# Patient Record
Sex: Female | Born: 1935 | Race: White | Hispanic: No | Smoking: Never smoker
Health system: Southern US, Community
[De-identification: ages and names within clinical notes are randomized; demographics above are authoritative.]

## PROBLEM LIST (undated history)

## (undated) DIAGNOSIS — F0390 Unspecified dementia without behavioral disturbance: Secondary | ICD-10-CM

## (undated) DIAGNOSIS — I10 Essential (primary) hypertension: Secondary | ICD-10-CM

---

## 2001-09-14 ENCOUNTER — Encounter: Admission: RE | Admit: 2001-09-14 | Discharge: 2001-09-14 | Payer: Self-pay | Admitting: Family Medicine

## 2001-09-14 ENCOUNTER — Encounter: Payer: Self-pay | Admitting: Family Medicine

## 2008-05-29 ENCOUNTER — Ambulatory Visit: Payer: Self-pay | Admitting: Cardiovascular Disease

## 2008-05-30 ENCOUNTER — Inpatient Hospital Stay (HOSPITAL_COMMUNITY): Admission: EM | Admit: 2008-05-30 | Discharge: 2008-06-03 | Payer: Self-pay | Admitting: Emergency Medicine

## 2008-05-31 ENCOUNTER — Encounter (INDEPENDENT_AMBULATORY_CARE_PROVIDER_SITE_OTHER): Payer: Self-pay | Admitting: Internal Medicine

## 2009-07-04 ENCOUNTER — Emergency Department (HOSPITAL_COMMUNITY): Admission: EM | Admit: 2009-07-04 | Discharge: 2009-07-04 | Payer: Self-pay | Admitting: Emergency Medicine

## 2010-07-02 LAB — URINALYSIS, ROUTINE W REFLEX MICROSCOPIC
Bilirubin Urine: NEGATIVE
Glucose, UA: NEGATIVE mg/dL
Specific Gravity, Urine: 1.014 (ref 1.005–1.030)
Urobilinogen, UA: 0.2 mg/dL (ref 0.0–1.0)

## 2010-07-02 LAB — COMPREHENSIVE METABOLIC PANEL
ALT: 14 U/L (ref 0–35)
AST: 20 U/L (ref 0–37)
Alkaline Phosphatase: 58 U/L (ref 39–117)
CO2: 26 mEq/L (ref 19–32)
Calcium: 8.4 mg/dL (ref 8.4–10.5)
Chloride: 106 mEq/L (ref 96–112)
GFR calc Af Amer: >60 mL/min (ref >60)
GFR calc non Af Amer: >60 mL/min (ref >60)
Potassium: 3.4 mEq/L — ABNORMAL LOW (ref 3.5–5.1)
Sodium: 137 mEq/L (ref 135–145)

## 2010-07-02 LAB — BLOOD GAS, ARTERIAL
Bicarbonate: 23.8 mEq/L (ref 20.0–24.0)
Drawn by: 309681
O2 Saturation: 98.6 %
O2 Saturation: 99 %
Patient temperature: 98.6
Patient temperature: 98.6
TCO2: 21.1 mmol/L (ref 0–100)
pH, Arterial: 7.507 — ABNORMAL HIGH (ref 7.350–7.400)
pH, Arterial: 7.6 — ABNORMAL HIGH (ref 7.350–7.400)

## 2010-07-02 LAB — CBC
HCT: 31.6 % — ABNORMAL LOW (ref 36.0–46.0)
HCT: 31.8 % — ABNORMAL LOW (ref 36.0–46.0)
HCT: 36.8 % (ref 36.0–46.0)
Hemoglobin: 10.6 g/dL — ABNORMAL LOW (ref 12.0–15.0)
Hemoglobin: 10.9 g/dL — ABNORMAL LOW (ref 12.0–15.0)
Hemoglobin: 11.2 g/dL — ABNORMAL LOW (ref 12.0–15.0)
MCHC: 33.5 g/dL (ref 30.0–36.0)
MCHC: 33.6 g/dL (ref 30.0–36.0)
MCV: 88.1 fL (ref 78.0–100.0)
MCV: 88.2 fL (ref 78.0–100.0)
Platelets: 285 10*3/uL (ref 150–400)
Platelets: 319 10*3/uL (ref 150–400)
RBC: 3.72 MIL/uL — ABNORMAL LOW (ref 3.87–5.11)
RDW: 13 % (ref 11.5–15.5)
RDW: 13.8 % (ref 11.5–15.5)
RDW: 13.9 % (ref 11.5–15.5)
WBC: 5.3 10*3/uL (ref 4.0–10.5)
WBC: 7.2 10*3/uL (ref 4.0–10.5)

## 2010-07-02 LAB — DIFFERENTIAL
Basophils Relative: 0 % (ref 0–1)
Basophils Relative: 1 % (ref 0–1)
Eosinophils Absolute: 0.1 10*3/uL (ref 0.0–0.7)
Eosinophils Absolute: 0.1 10*3/uL (ref 0.0–0.7)
Eosinophils Relative: 1 % (ref 0–5)
Eosinophils Relative: 2 % (ref 0–5)
Lymphs Abs: 1 10*3/uL (ref 0.7–4.0)
Lymphs Abs: 1.1 10*3/uL (ref 0.7–4.0)
Neutrophils Relative %: 79 % — ABNORMAL HIGH (ref 43–77)

## 2010-07-02 LAB — LIPID PANEL
LDL Cholesterol: 65 mg/dL (ref 0–99)
Total CHOL/HDL Ratio: 3 RATIO
Triglycerides: 51 mg/dL (ref <150)
VLDL: 10 mg/dL (ref 0–40)

## 2010-07-02 LAB — AMMONIA: Ammonia: 15 umol/L (ref 11–35)

## 2010-07-02 LAB — POCT I-STAT, CHEM 8
Creatinine, Ser: 0.9 mg/dL (ref 0.4–1.2)
HCT: 38 % (ref 36.0–46.0)
Hemoglobin: 12.9 g/dL (ref 12.0–15.0)
Potassium: 3.9 mEq/L (ref 3.5–5.1)
Sodium: 135 mEq/L (ref 135–145)
TCO2: 28 mmol/L (ref 0–100)

## 2010-07-02 LAB — URINE MICROSCOPIC-ADD ON

## 2010-07-02 LAB — BASIC METABOLIC PANEL
CO2: 25 mEq/L (ref 19–32)
Chloride: 108 mEq/L (ref 96–112)
GFR calc Af Amer: >60 mL/min (ref >60)
Sodium: 140 mEq/L (ref 135–145)

## 2010-07-02 LAB — HEMOGLOBIN A1C: Hgb A1c MFr Bld: 5.9 % (ref 4.6–6.1)

## 2010-07-02 LAB — CARDIAC PANEL(CRET KIN+CKTOT+MB+TROPI)
CK, MB: 3.8 ng/mL (ref 0.3–4.0)
CK, MB: 4.6 ng/mL — ABNORMAL HIGH (ref 0.3–4.0)
Relative Index: 3 — ABNORMAL HIGH (ref 0.0–2.5)
Total CK: 152 U/L (ref 7–177)

## 2010-07-02 LAB — TROPONIN I
Troponin I: 0.19 ng/mL — ABNORMAL HIGH (ref 0.00–0.06)
Troponin I: 0.3 ng/mL — ABNORMAL HIGH (ref 0.00–0.06)

## 2010-07-02 LAB — URINE CULTURE: Colony Count: NO GROWTH

## 2010-07-02 LAB — CK TOTAL AND CKMB (NOT AT ARMC)
CK, MB: 3 ng/mL (ref 0.3–4.0)
Relative Index: 2.4 (ref 0.0–2.5)

## 2010-07-10 ENCOUNTER — Other Ambulatory Visit (HOSPITAL_COMMUNITY): Payer: Self-pay | Admitting: Physical Medicine and Rehabilitation

## 2010-07-10 DIAGNOSIS — Z780 Asymptomatic menopausal state: Secondary | ICD-10-CM

## 2010-07-10 DIAGNOSIS — T07XXXA Unspecified multiple injuries, initial encounter: Secondary | ICD-10-CM

## 2010-07-13 ENCOUNTER — Ambulatory Visit (HOSPITAL_COMMUNITY)
Admission: RE | Admit: 2010-07-13 | Discharge: 2010-07-13 | Disposition: A | Discharge status: Home / Self Care | Payer: Medicare Other | Source: Ambulatory Visit | Attending: Physical Medicine and Rehabilitation | Admitting: Physical Medicine and Rehabilitation

## 2010-07-13 DIAGNOSIS — Z13820 Encounter for screening for osteoporosis: Secondary | ICD-10-CM | POA: Insufficient documentation

## 2010-07-13 DIAGNOSIS — T07XXXA Unspecified multiple injuries, initial encounter: Secondary | ICD-10-CM

## 2010-07-13 DIAGNOSIS — Z8781 Personal history of (healed) traumatic fracture: Secondary | ICD-10-CM | POA: Insufficient documentation

## 2010-07-13 DIAGNOSIS — Z780 Asymptomatic menopausal state: Secondary | ICD-10-CM | POA: Insufficient documentation

## 2010-08-04 NOTE — H&P (Signed)
NAMENYSSA, SAYEGH NO.:  [id number]   MEDICAL RECORD NO.:  [id number]          PATIENT TYPE:  EMS   LOCATION:  ED                           FACILITY:  [facility]   PHYSICIAN:  Oswald Hillock, MD        DATE OF BIRTH:  [birth year]   DATE OF ADMISSION:  05/29/2008  DATE OF DISCHARGE:                              HISTORY & PHYSICAL   CHIEF COMPLAINT:  Altered mental status.   HISTORY OF PRESENT ILLNESS:  The patient is an 77 year old Caucasian  female who presents to the emergency room after she was reported by  family crawling on the floor in a defensive posture.  Apparently, she  has been having upper respiratory symptoms for the last few days  including a productive cough, however, no fever, rigors, chills,  abdominal pain, nausea, vomiting, diarrhea dizziness, loss of  consciousness or any focal weakness of any part of the body reported.  The patient was not on any medications and has been living with her  daughter off late.  Daughter states that she does have some memory loss  but no significant problems as a result of that.  The patient is very  active and independent in all her ADLs as per the daughter who  accompanies her.   PAST MEDICAL HISTORY:  None.   PAST SURGICAL HISTORY:  D&C and lumpectomy.   SOCIAL HISTORY:  No history of alcohol, tobacco or drug use.  The  patient lives with family.   REVIEW OF SYSTEMS:  An extensive systems was done and all systems are  negative except for the positives mentioned in the history of present  illness.  The patient denies any chest pain, shortness of breath at any  point in time.   PHYSICAL EXAMINATION:  VITALS:  On admission pulse 100, blood pressure  178/81, respiratory rate of 18 with, oxygen saturations of 96% on room  air.  Temperature was initially 99.3.  A recheck was 100.4.  GENERAL:  Elderly Caucasian female in no acute distress, alert, oriented  to person, place and time but does have some deficits with  both recent  as well as remote memory.  HEENT: No scleral icterus.  No pallor.  Ears negative.  No significant  oropharyngeal lesions.  NECK:  Supple.  No lymphadenopathy.  No JVD.  CHEST:  Breath sounds heard bilaterally.  Bilateral rhonchi.  CVS:  S1, S2 plus, regular tachycardia.  No gallop or rub.  ABDOMEN:  Abdomen is soft, nontender.  Bowel sounds present.  EXTREMITIES:  No cyanosis, clubbing or edema.  NECK:  Cranial nerves II to XII are grossly intact.  Motor examination:  Power grade 5/5 all over.  Reflexes are present.  Plantars are  downgoing.  No cerebellar signs.  Gait was not checked.   LABORATORY DATA:  CBC:  WBC count 10.4, hemoglobin 12.3, hematocrit  36.8, platelet count of 319.  Urinalysis showed 0-2 WBCs, 11-20 RBCs,  negative leukocytes.  Her sodium was 135, potassium 3.9, chloride 99,  CO2 was 28, glucose was 103, BUN 13, creatinine 0.9, CK was 109, MB 2.8,  index of 2.4, troponin was 0.19.  EKG showed normal sinus rhythm, normal  PR interval, normal QRS duration, T inversions in the lead II.  No  previous EKGs available for comparison.  Chest x-ray showed chronic  obstructive pulmonary disease/chronic bronchitis without convincing  evidence of acute superimposed process, right greater than left  bilateral pleural parenchymal opacity may be related to scarring and  probable epicardial fat prominence at the right cardiophrenic angle. CT  head:  No acute intracranial abnormality.   IMPRESSION AND PLAN:  This is a case of an 77 year old Caucasian female  with no significant medical problems who presents with altered mental  status.  1. Altered mental status.  Etiology is unclear.  However, it is clear      from the history that the patient has some baseline memory loss.      No focal abnormalities on examination and a CT scan of the chest is      negative.  No metabolic abnormalities or significant infectious      process to account for alteration in her mental  status.  Will      monitor her overnight and order an MRI of the brain in the morning      if she does not show any signs of improvement.  2. Chronic obstructive pulmonary disease exacerbation.  Based on      elevated white count and fever we will treat her as COPD      exacerbation.  Start her on Avelox 4 mg IV.  Get blood cultures x2      and use Xopenex and Atrovent nebs q. 4 and q. 12 h. p.r.n. Will get      a baseline ABG and follow up with the results.  3. Abnormal troponin.  The patient's initial troponin was elevated      0.19.  EKG shows nonspecific changes.  Will admit her to telemetry,      recycle enzymes and cardiology will was consulted by the emergency      room physician.  The likelihood of her elevated troponin being      related to bronchitis is high though.  4. DVT/GI prophylaxis.  Pepcid and subcu heparin.      Oswald Hillock, MD  Electronically Signed     BA/MEDQ  D:  05/30/2008  T:  05/30/2008  Job:  [id number]

## 2010-08-04 NOTE — Discharge Summary (Signed)
[name], PATNODE NO.:  [id number]   MEDICAL RECORD NO.:  [id number]          PATIENT TYPE:  INP   LOCATION:  1419                         FACILITY:  [facility]   PHYSICIAN:  Lonia Blood, M.D.DATE OF BIRTH:  1919/02/11   DATE OF ADMISSION:  05/29/2008  DATE OF DISCHARGE:  06/03/2008                               DISCHARGE SUMMARY   PRIMARY CARE PHYSICIAN:  Gloriajean Dell. Andrey Campanile, M.D.   DISCHARGE DIAGNOSES:  1. Trilobar community-acquired pneumonia.  2. Acute delirium with moderate baseline dementia.      a.     Metabolic workup unrevealing.      b.     Greatly improved with treatment of acute illness.      c.     Likely component of sundowning involved.      d.     CT scan of the head revealing mild generalized volume loss       for age but no acute deficits.  3. Mild aortic stenosis via echocardiogram, asymptomatic.  4. Normocytic anemia, likely related to acute disease.  Outpatient      followup recommended.  5. Elevated troponin, likely secondary to severe pulmonary disease.      No cardiac symptoms.   DISCHARGE MEDICATIONS:  Avelox 400 mg p.o. daily x5 days, then stop.    Patient is advised to keep her scheduled follow-up appointment with Dr.  Benedetto Goad on June 05, 2008.  At that time, the patient should simply  be assessed to assure that she has continued to recover from her  pneumonia.   PROCEDURES:  1. Transthoracic echocardiogram on May 31, 2008.  Overall LV      systolic function normal.  Ejection fraction 60%.  Very mild aortic      valve stenosis appreciable.  2. CT scan of the head on May 29, 2008.  No acute intracranial      abnormality.  Mild generalized volume loss for age.  3. CT scan of the chest on May 30, 2008.  Dense consolidation,      medial segment of the right middle lobe.  Right hilar and      subcarinal lymphadenopathy, which appears to be reactive.  Sequelae      of pulmonary granulomatous disease in the right lung  with biapical      scarring.  Chronic nonunited lower sternum fracture.   HOSPITAL COURSE:  Ms. Jessica Hodge is a very pleasant 77 year old  female who resides in a private home locally with care from her family.  She presented to the hospital on the date of her admission with  complaints of significant altered mental status.  The family stated at  her baseline the patient did display some early signs of dementia but  otherwise was interactive.  She had become much more lethargic and  severely confused.  Initial evaluation suggested the patient was likely  suffering from a pneumonia.  A CT scan was carried out for further  evaluation and did in fact confirm a right middle lobe pneumonia.  Subsequent chest x-ray suggested actual trilobar pneumonia, including  right middle,  right lower, and left lower lobe pneumonia.  The patient  was treated empirically with IV antibiotics, and symptomatic management  was accomplished with nebulizers.  Patient tolerated this quite well and  made a significant rally.  From a pulmonary standpoint, her lungs are  now clear to auscultation.  Her O2 sats are normal on room air.  She is  displaying no clinical evidence of ongoing infection.  She is tolerating  a titration from IV antibiotics to p.o. antibiotics without significant  difficulty.   During the patient's hospital stay, she was appreciated to have a  significant systolic murmur.  Echocardiogram was accomplished.  This  revealed a preserved LV systolic function with a preserved EF.  Of note  was a mild aortic stenosis which was likely the etiology of the  patient's murmur.  This is now clinically well compensated.   As noted above, the patient's reported significant confusion at home at  baseline.  This had worsened severely at the time of her acute  hospitalization.  It was felt that she likely suffers with a baseline  vascular dementia, and it was worsened with acute illness and then  further  compounded by sundowning during her hospital stay.  Full  metabolic workup was accomplished, including normal folic acid, normal  B12, normal TSH, normal ammonia, and a normal pCO2.   With treatment of her acute issues, the patient returned to what her  family states is her baseline mental status.      Lonia Blood, M.D.  Electronically Signed     JTM/MEDQ  D:  06/03/2008  T:  06/03/2008  Job:  [id number]   cc:   Gloriajean Dell. Andrey Campanile, M.D.  Fax: [phone]

## 2010-09-30 ENCOUNTER — Emergency Department (HOSPITAL_COMMUNITY)
Admission: EM | Admit: 2010-09-30 | Discharge: 2010-09-30 | Disposition: A | Discharge status: Home / Self Care | Payer: Medicare Other | Attending: Emergency Medicine | Admitting: Emergency Medicine

## 2010-09-30 ENCOUNTER — Emergency Department (HOSPITAL_COMMUNITY): Payer: Medicare Other

## 2010-09-30 DIAGNOSIS — Y92009 Unspecified place in unspecified non-institutional (private) residence as the place of occurrence of the external cause: Secondary | ICD-10-CM | POA: Insufficient documentation

## 2010-09-30 DIAGNOSIS — S0100XA Unspecified open wound of scalp, initial encounter: Secondary | ICD-10-CM | POA: Insufficient documentation

## 2010-09-30 DIAGNOSIS — F0280 Dementia in other diseases classified elsewhere without behavioral disturbance: Secondary | ICD-10-CM | POA: Insufficient documentation

## 2010-09-30 DIAGNOSIS — Z79899 Other long term (current) drug therapy: Secondary | ICD-10-CM | POA: Insufficient documentation

## 2010-09-30 DIAGNOSIS — S0990XA Unspecified injury of head, initial encounter: Secondary | ICD-10-CM | POA: Insufficient documentation

## 2010-09-30 DIAGNOSIS — Y998 Other external cause status: Secondary | ICD-10-CM | POA: Insufficient documentation

## 2010-09-30 DIAGNOSIS — W06XXXA Fall from bed, initial encounter: Secondary | ICD-10-CM | POA: Insufficient documentation

## 2010-09-30 DIAGNOSIS — I10 Essential (primary) hypertension: Secondary | ICD-10-CM | POA: Insufficient documentation

## 2015-05-22 ENCOUNTER — Emergency Department (HOSPITAL_COMMUNITY)
Admission: EM | Admit: 2015-05-22 | Discharge: 2015-05-22 | Disposition: A | Discharge status: Home / Self Care | Payer: Medicaid Other | Attending: Emergency Medicine | Admitting: Emergency Medicine

## 2015-05-22 ENCOUNTER — Encounter (HOSPITAL_COMMUNITY): Payer: Self-pay | Admitting: Emergency Medicine

## 2015-05-22 DIAGNOSIS — Y9389 Activity, other specified: Secondary | ICD-10-CM | POA: Diagnosis not present

## 2015-05-22 DIAGNOSIS — I10 Essential (primary) hypertension: Secondary | ICD-10-CM | POA: Insufficient documentation

## 2015-05-22 DIAGNOSIS — Y998 Other external cause status: Secondary | ICD-10-CM | POA: Insufficient documentation

## 2015-05-22 DIAGNOSIS — F0390 Unspecified dementia without behavioral disturbance: Secondary | ICD-10-CM | POA: Diagnosis not present

## 2015-05-22 DIAGNOSIS — Y92129 Unspecified place in nursing home as the place of occurrence of the external cause: Secondary | ICD-10-CM | POA: Diagnosis not present

## 2015-05-22 DIAGNOSIS — S81812A Laceration without foreign body, left lower leg, initial encounter: Secondary | ICD-10-CM | POA: Diagnosis not present

## 2015-05-22 DIAGNOSIS — W268XXA Contact with other sharp object(s), not elsewhere classified, initial encounter: Secondary | ICD-10-CM | POA: Diagnosis not present

## 2015-05-22 DIAGNOSIS — S8992XA Unspecified injury of left lower leg, initial encounter: Secondary | ICD-10-CM | POA: Diagnosis present

## 2015-05-22 HISTORY — DX: Unspecified dementia, unspecified severity, without behavioral disturbance, psychotic disturbance, mood disturbance, and anxiety: F03.90

## 2015-05-22 HISTORY — DX: Essential (primary) hypertension: I10

## 2015-05-22 NOTE — ED Provider Notes (Signed)
CSN: [id number]     Arrival date & time 05/22/15  0807 History   First MD Initiated Contact with Patient 05/22/15 0815     Chief Complaint  Patient presents with  . Leg Injury     (Consider location/radiation/quality/duration/timing/severity/associated sxs/prior Treatment) Patient is a 82 y.o. female presenting with skin laceration. The history is provided by the EMS personnel (Patient got her left leg injured when she was moved into a wheelchair. She is small laceration to left lower leg).  Laceration Location: Left lower leg. Depth:  Cutaneous Quality: jagged   Bleeding: controlled   Laceration mechanism:  Blunt object   Past Medical History  Diagnosis Date  . Dementia   . Hypertension    History reviewed. No pertinent past surgical history. History reviewed. No pertinent family history. Social History  Substance Use Topics  . Smoking status: Never Smoker   . Smokeless tobacco: None  . Alcohol Use: No   OB History    No data available     Review of Systems  Unable to perform ROS: Dementia      Allergies  Cranberry; Diphtheria toxoid; Soybean-containing drug products; and Tetanus toxoids  Home Medications   Prior to Admission medications   Not on File   BP 171/89 mmHg  Pulse 62  Temp(Src) 97.4 F (36.3 C) (Oral)  Resp 16  SpO2 98% Physical Exam  Constitutional: She appears well-developed.  HENT:  Head: Normocephalic.  Eyes: Conjunctivae are normal.  Neck: No tracheal deviation present.  Cardiovascular:  No murmur heard. Musculoskeletal: Normal range of motion.  3 cm superficial laceration and skin care to left lower leg.  Neurological: She is alert.  Patient is alert but nonverbal.  Does not follow commands this is her normal she has dementia  Skin: Skin is warm.  Psychiatric: She has a normal mood and affect.    ED Course  Procedures (including critical care time) Labs Review Labs Reviewed - No data to display  Imaging Review No results  found. I have personally reviewed and evaluated these images and lab results as part of my medical decision-making.   EKG Interpretation None      MDM   Final diagnoses:  Laceration of leg, left, initial encounter   Pt with skin tare.  Will dress daily and have her follow up as needed.  Pt is allergic to tetanus shots      Bethann Berkshire, MD 05/22/15 [phone]

## 2015-05-22 NOTE — ED Notes (Signed)
Per EMS. Pt from [facility]. Hx dementia, oriented per norm. Hard of hearing. Pt scraped L lower leg when transferring from bed to a wheelchair. EMS report 4" lac to L lower leg. No bleeding at present time. Pt did not fall or hit head. Not on blood thinners.

## 2015-05-22 NOTE — ED Notes (Signed)
Bed: ZO10 Expected date:  Expected time:  Means of arrival:  Comments: EMS 30 female skin lac from nursing home

## 2015-05-22 NOTE — Discharge Instructions (Signed)
Clean wound twice a day with soap and water gently. Then redress follow-up as needed

## 2017-06-08 ENCOUNTER — Emergency Department (HOSPITAL_COMMUNITY)
Admission: EM | Admit: 2017-06-08 | Discharge: 2017-06-09 | Disposition: A | Discharge status: Skilled Nursing Facility | Payer: Medicare Other | Attending: Emergency Medicine | Admitting: Emergency Medicine

## 2017-06-08 ENCOUNTER — Emergency Department (HOSPITAL_COMMUNITY): Payer: Medicare Other

## 2017-06-08 DIAGNOSIS — X58XXXA Exposure to other specified factors, initial encounter: Secondary | ICD-10-CM | POA: Insufficient documentation

## 2017-06-08 DIAGNOSIS — Y939 Activity, unspecified: Secondary | ICD-10-CM | POA: Insufficient documentation

## 2017-06-08 DIAGNOSIS — S61401A Unspecified open wound of right hand, initial encounter: Secondary | ICD-10-CM | POA: Insufficient documentation

## 2017-06-08 DIAGNOSIS — Y92129 Unspecified place in nursing home as the place of occurrence of the external cause: Secondary | ICD-10-CM | POA: Insufficient documentation

## 2017-06-08 DIAGNOSIS — R52 Pain, unspecified: Secondary | ICD-10-CM

## 2017-06-08 DIAGNOSIS — I10 Essential (primary) hypertension: Secondary | ICD-10-CM | POA: Diagnosis not present

## 2017-06-08 DIAGNOSIS — Y999 Unspecified external cause status: Secondary | ICD-10-CM | POA: Diagnosis not present

## 2017-06-08 DIAGNOSIS — L03011 Cellulitis of right finger: Secondary | ICD-10-CM | POA: Diagnosis not present

## 2017-06-08 DIAGNOSIS — F0390 Unspecified dementia without behavioral disturbance: Secondary | ICD-10-CM | POA: Insufficient documentation

## 2017-06-08 DIAGNOSIS — Z79899 Other long term (current) drug therapy: Secondary | ICD-10-CM | POA: Diagnosis not present

## 2017-06-08 MED ORDER — CEPHALEXIN 250 MG PO CAPS
250.0000 mg | ORAL_CAPSULE | Freq: Once | ORAL | Status: AC
  Administered 2017-06-08: 250 mg via ORAL
  Filled 2017-06-08: qty 1

## 2017-06-08 MED ORDER — CEPHALEXIN 250 MG PO CAPS: 250.0000 mg | ORAL_CAPSULE | Freq: Four times a day (QID) | ORAL | 0 refills | Status: AC

## 2017-06-08 NOTE — ED Triage Notes (Addendum)
Pt to ed via [facility] with c/o of hand laceration. No fall reported. Pt hx of alzheimers and dementia, HTN, THyroid disease  Vitals bp 126/72 Pulse 80 rr 16

## 2017-06-08 NOTE — ED Provider Notes (Signed)
[facility]-EMERGENCY DEPT Provider Note   CSN: [id number] Arrival date & time: 06/08/17  2059     History   Chief Complaint Chief Complaint  Patient presents with  . Hand Injury    small laceration    HPI Jessica Hodge is a 84 y.o. female.  Patient is a 84 year old female with a history of hypertension, dementia who is presenting today by EMS from her skilled nursing facility with concern for a cut on her left hand.  Patient typically keeps her hands clenched at all times but they started to notice some redness and swelling of her thumb.  She was able to relax her hand a little bit and they thought they saw a laceration.  They are not aware of any injuries.  Patient is otherwise been acting her normal self.  On history taking patient smiles and states she feels fine   The history is provided by the nursing home. The history is limited by the condition of the patient (dementia).    Past Medical History:  Diagnosis Date  . Dementia   . Hypertension     There are no active problems to display for this patient.   No past surgical history on file.  OB History    No data available       Home Medications    Prior to Admission medications   Medication Sig Start Date End Date Taking? Authorizing Provider  aspirin EC 81 MG tablet Take 162 mg by mouth daily.   Yes [provider]  ergocalciferol (VITAMIN D2) 50000 units capsule Take 50,000 Units by mouth every 30 (thirty) days.   Yes [provider]  furosemide (LASIX) 20 MG tablet Take 20 mg by mouth daily.  05/13/17  Yes [provider]  metoprolol succinate (TOPROL-XL) 50 MG 24 hr tablet Take 50 mg by mouth daily.  06/05/17  Yes [provider]  potassium chloride (K-DUR,KLOR-CON) 10 MEQ tablet Take 10 mEq by mouth daily.  05/24/17  Yes [provider]  levothyroxine (SYNTHROID, LEVOTHROID) 75 MCG tablet Take 75 mcg by mouth daily before breakfast.  05/26/17    [provider]  memantine (NAMENDA) 10 MG tablet Take 10 mg by mouth daily.  06/07/17   [provider]  sertraline (ZOLOFT) 25 MG tablet  03/22/17   [provider]    Family History No family history on file.  Social History Social History   Tobacco Use  . Smoking status: Never Smoker  Substance Use Topics  . Alcohol use: No  . Drug use: No     Allergies   Cranberry; Diphtheria toxoid; Soybean-containing drug products; and Tetanus toxoids   Review of Systems Review of Systems  Unable to perform ROS: Dementia     Physical Exam Updated Vital Signs BP (!) 165/60 (BP Location: Left Arm)   Pulse 63   Temp (!) 97.4 F (36.3 C) (Oral)   Resp 16   SpO2 95%   Physical Exam  Constitutional: She appears well-developed and well-nourished. No distress.  HENT:  Head: Normocephalic and atraumatic.  Eyes: EOM are normal. Pupils are equal, round, and reactive to light.  Cardiovascular: Normal rate.  Pulmonary/Chest: Effort normal.  Musculoskeletal: She exhibits tenderness.       Hands: Neurological: She is alert.  Skin: Skin is warm. There is erythema.  Psychiatric:  Calm and cooperative.  Communication barrier due to dementia  Nursing note and vitals reviewed.    ED Treatments / Results  Labs (all labs ordered are listed, but only abnormal results are displayed) Labs Reviewed - No data to display  EKG  EKG Interpretation None       Radiology No results found.  Procedures Procedures (including critical care time)  Medications Ordered in ED Medications - No data to display   Initial Impression / Assessment and Plan / ED Course  I have reviewed the triage vital signs and the nursing notes.  Pertinent labs & imaging results that were available during my care of the patient were reviewed by me and considered in my medical decision making (see chart for details).     Elderly patient presenting today with concern for wound  infection of her right hand.  Patient chronically has clinched fists bilaterally but staff started noticing some swelling and redness of her right thumb.  On exam patient has a wound in the webspace with cellulitis of the thumb.  There are no signs of laceration and fingers 2 through 5 appear normal.  Will do an x-ray but then patient would benefit from antibiotics and local wound care.  Otherwise she is well-appearing.  Vital signs are reassuring and she is displaying no signs concerning for sepsis.  X-ray with swelling but no osteo. Will treat with kelfex and local wound care.  Final Clinical Impressions(s) / ED Diagnoses   Final diagnoses:  Cellulitis of finger of right hand  Open wound of right hand except fingers with complication, initial encounter    ED Discharge Orders        Ordered    cephALEXin (KEFLEX) 250 MG capsule  4 times daily     06/08/17 2216       Gwyneth Sprout, MD 06/08/17 2217

## 2017-06-08 NOTE — Discharge Instructions (Signed)
Take antibiotic for the next week for infection.  Local wound care with wet to dry dressings and needs to be evaluated by wound care.

## 2017-06-09 NOTE — ED Notes (Signed)
Pt unable to sign due to conditoin

## 2018-09-16 IMAGING — CR DG HAND COMPLETE 3+V*R*
3 series · 3 of 3 positions shown · non-contrast
Comparison: None.

CLINICAL DATA: Right hand laceration with swelling and erythema.

EXAM:
RIGHT HAND - COMPLETE 3+ VIEW

[x hand pa right]
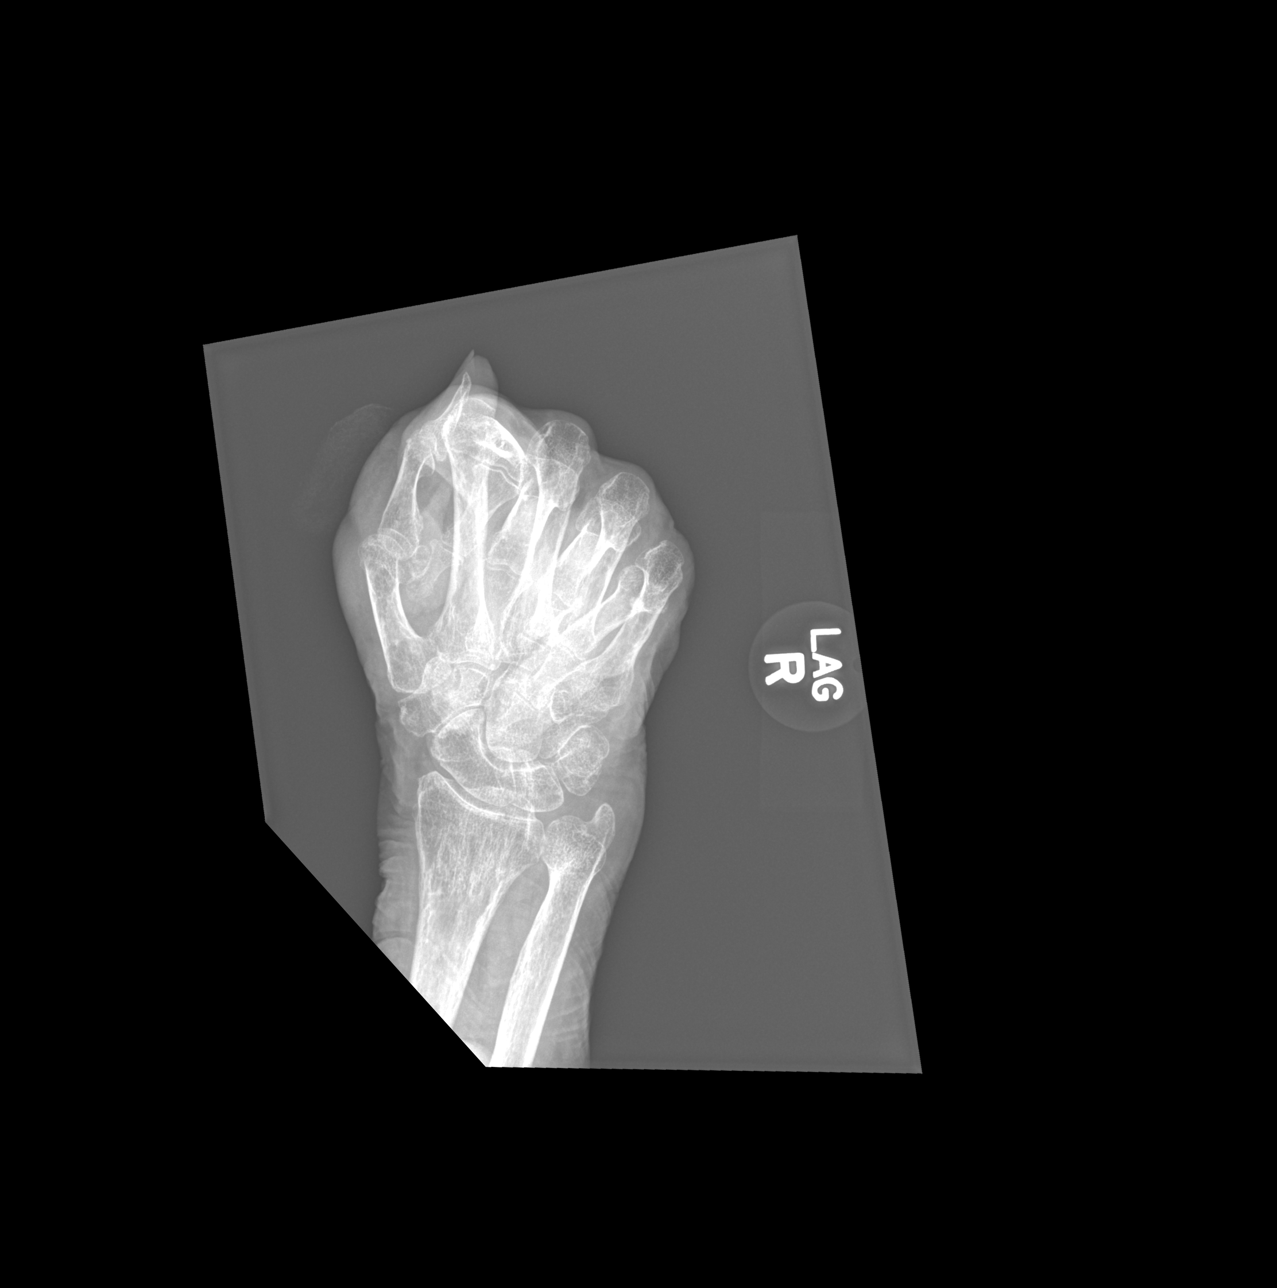

[x hand obl right]
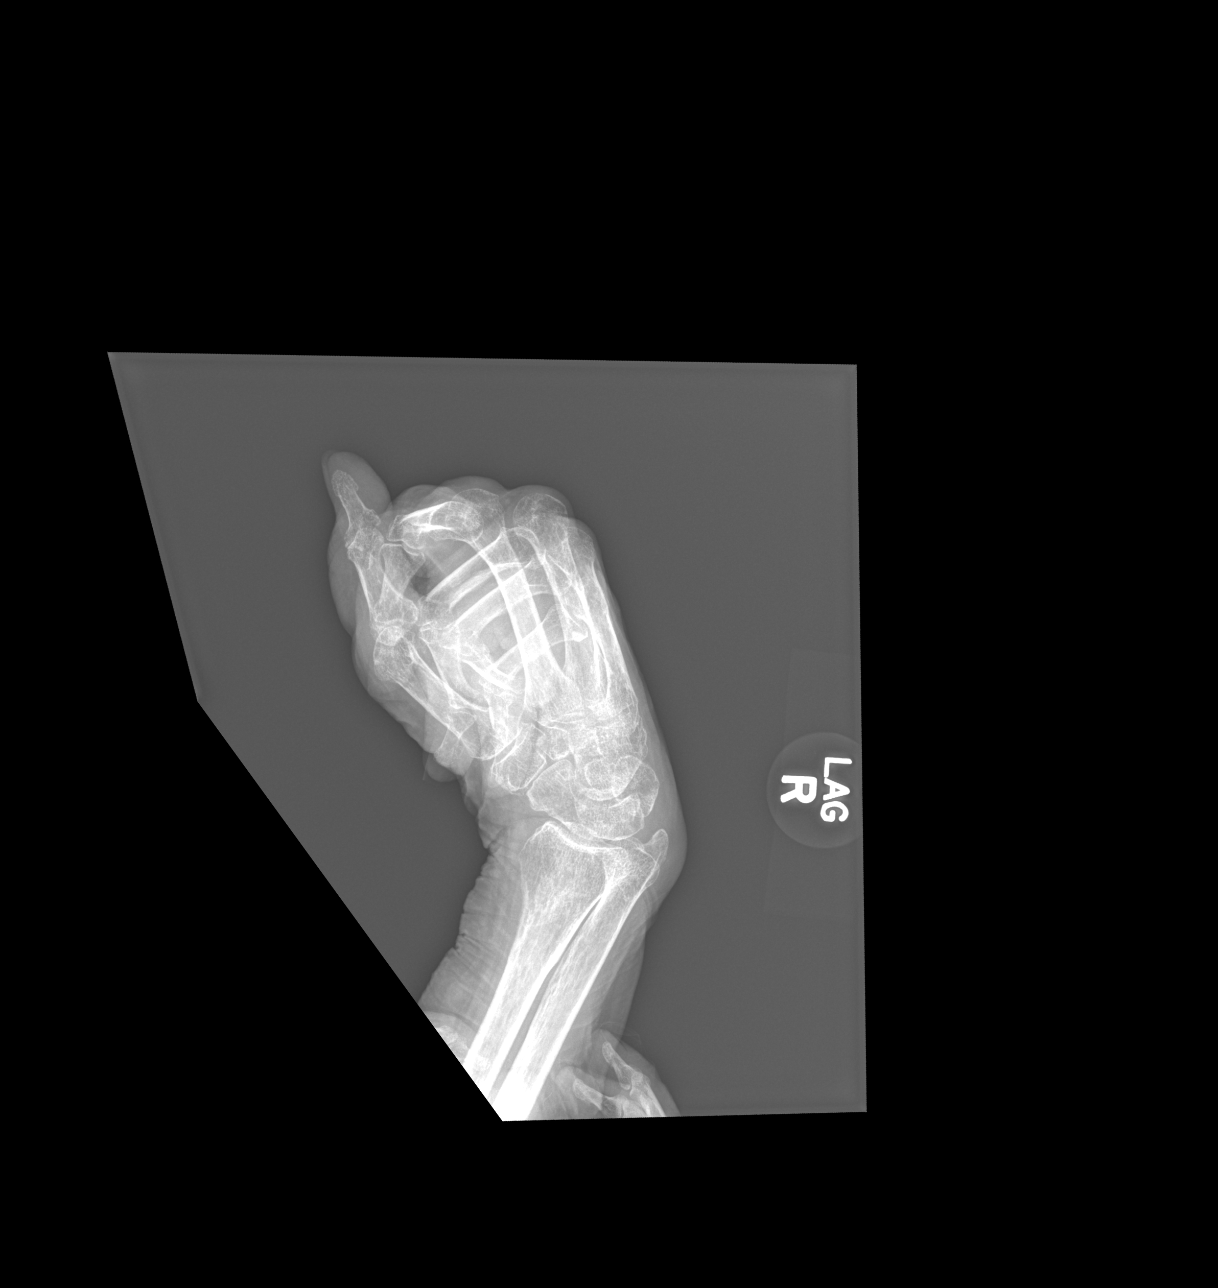

[x hand lat right]
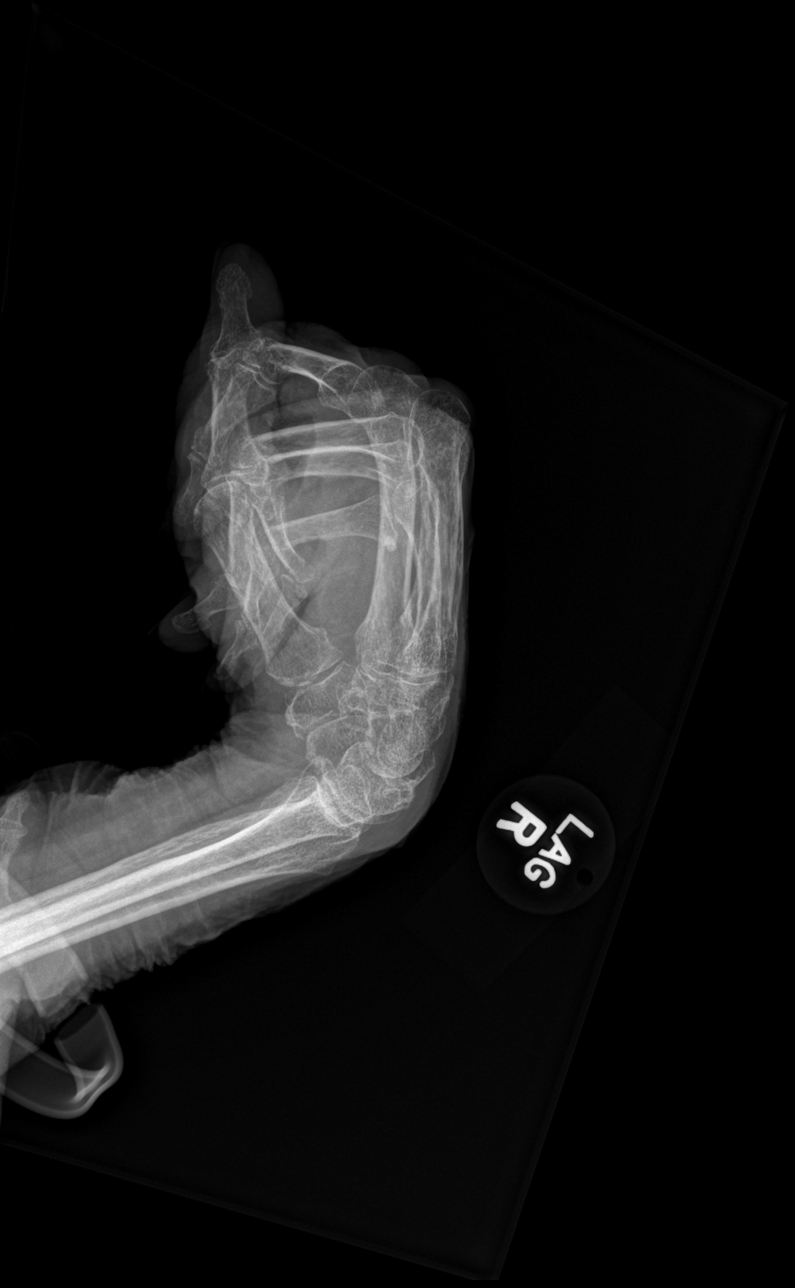

[3 of 3 positions shown; findings below may reference images not displayed]

FINDINGS: The bones are diffusely osteopenic. The images are obtained with the
hand in clinched position. This limits evaluation of the bones and
joints. Within that limitation, there is no focal osteolysis of the
area between the first and second metacarpals. I do not see an acute
fracture.
IMPRESSION: Limited examination due to nonstandard positioning. Within that
limitation, no evidence of osteomyelitis at the reported site of
inflammation between the thumb and index finger.

## 2018-09-20 DEATH — deceased
